# Patient Record
Sex: Male | Born: 1995 | Race: White | Hispanic: No | Marital: Single | Smoking: Never smoker
Health system: Southern US, Community
[De-identification: ages and names within clinical notes are randomized; demographics above are authoritative.]

## PROBLEM LIST (undated history)

## (undated) HISTORY — PX: TUMOR REMOVAL: SHX12

---
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
## 2016-06-25 ENCOUNTER — Emergency Department
Admission: EM | Admit: 2016-06-25 | Discharge: 2016-06-25 | Disposition: A | Discharge status: Home / Self Care | Payer: BC Managed Care – PPO | Attending: Emergency Medicine | Admitting: Emergency Medicine

## 2016-06-25 ENCOUNTER — Emergency Department: Payer: BC Managed Care – PPO

## 2016-06-25 DIAGNOSIS — R569 Unspecified convulsions: Secondary | ICD-10-CM | POA: Diagnosis not present

## 2016-06-25 DIAGNOSIS — R51 Headache: Secondary | ICD-10-CM | POA: Insufficient documentation

## 2016-06-25 DIAGNOSIS — Z5181 Encounter for therapeutic drug level monitoring: Secondary | ICD-10-CM | POA: Insufficient documentation

## 2016-06-25 DIAGNOSIS — M25511 Pain in right shoulder: Secondary | ICD-10-CM | POA: Insufficient documentation

## 2016-06-25 LAB — CBC WITH DIFFERENTIAL/PLATELET
BASOS ABS: 0 10*3/uL (ref 0–0.1)
Basophils Relative: 0 %
EOS ABS: 0.1 10*3/uL (ref 0–0.7)
EOS PCT: 1 %
HCT: 45.9 % (ref 40.0–52.0)
Hemoglobin: 15.9 g/dL (ref 13.0–18.0)
Lymphocytes Relative: 34 %
Lymphs Abs: 2 10*3/uL (ref 1.0–3.6)
MCH: 29.5 pg (ref 26.0–34.0)
MCHC: 34.7 g/dL (ref 32.0–36.0)
MCV: 85.1 fL (ref 80.0–100.0)
Monocytes Absolute: 0.4 10*3/uL (ref 0.2–1.0)
Monocytes Relative: 7 %
Neutro Abs: 3.4 10*3/uL (ref 1.4–6.5)
Neutrophils Relative %: 58 %
PLATELETS: 213 10*3/uL (ref 150–440)
RBC: 5.39 MIL/uL (ref 4.40–5.90)
RDW: 12.8 % (ref 11.5–14.5)
WBC: 5.9 10*3/uL (ref 3.8–10.6)

## 2016-06-25 LAB — COMPREHENSIVE METABOLIC PANEL
ALT: 28 U/L (ref 17–63)
AST: 32 U/L (ref 15–41)
Albumin: 4.5 g/dL (ref 3.5–5.0)
Alkaline Phosphatase: 69 U/L (ref 38–126)
Anion gap: 12 (ref 5–15)
BILIRUBIN TOTAL: 1.6 mg/dL — AB (ref 0.3–1.2)
BUN: 13 mg/dL (ref 6–20)
CALCIUM: 9.2 mg/dL (ref 8.9–10.3)
CO2: 22 mmol/L (ref 22–32)
CREATININE: 0.89 mg/dL (ref 0.61–1.24)
Chloride: 103 mmol/L (ref 101–111)
GFR calc Af Amer: >60 mL/min (ref >60)
Glucose, Bld: 115 mg/dL — ABNORMAL HIGH (ref 65–99)
Potassium: 3.7 mmol/L (ref 3.5–5.1)
Sodium: 137 mmol/L (ref 135–145)
TOTAL PROTEIN: 7.4 g/dL (ref 6.5–8.1)

## 2016-06-25 LAB — URINE DRUG SCREEN, QUALITATIVE (ARMC ONLY)
AMPHETAMINES, UR SCREEN: NOT DETECTED
BARBITURATES, UR SCREEN: NOT DETECTED
Benzodiazepine, Ur Scrn: NOT DETECTED
COCAINE METABOLITE, UR ~~LOC~~: NOT DETECTED
Cannabinoid 50 Ng, Ur ~~LOC~~: NOT DETECTED
MDMA (Ecstasy)Ur Screen: NOT DETECTED
METHADONE SCREEN, URINE: NOT DETECTED
OPIATE, UR SCREEN: NOT DETECTED
Phencyclidine (PCP) Ur S: NOT DETECTED
Tricyclic, Ur Screen: NOT DETECTED

## 2016-06-25 LAB — URINALYSIS COMPLETE WITH MICROSCOPIC (ARMC ONLY)
BILIRUBIN URINE: NEGATIVE
GLUCOSE, UA: NEGATIVE mg/dL
HGB URINE DIPSTICK: NEGATIVE
Ketones, ur: NEGATIVE mg/dL
LEUKOCYTES UA: NEGATIVE
Nitrite: NEGATIVE
Protein, ur: NEGATIVE mg/dL
Specific Gravity, Urine: 1.018 (ref 1.005–1.030)
pH: 6 (ref 5.0–8.0)

## 2016-06-25 NOTE — ED Provider Notes (Signed)
[facility] Emergency Department Provider Note ____________________________________________   I have reviewed the triage vital signs and the triage nursing note.  HISTORY  Chief Complaint Seizures   Historian Patient, mom and dad and other family members at the bedside   HPI Steven Keller is a 20 y.o. male with no significant PMH, presents after witnessed seizure, first time.  Mom heard thud and went into the room and found the patient lying on the ground with arms tense and shaking, and foaming at the mouth. She thinks this probably lasted less than 5 minutes and then the patient was limp and took maybe about 20 minutes or so to come back around to normal mental status. Patient reports no symptoms prior to the episode. He states he woke up around 8 AM and still felt tired and somewhat back to sleep. The next thing he remembers is waking up in the EMS truck.  Family history seizures. No personal history of seizures. He did bite his tongue. He did strike his head and has a bit of headache. Denies vision changes. Denies weakness or numbness. Denies chest pain. Denies recent coughing or recent illnesses.  Mental status is resolved now.    History reviewed. No pertinent past medical history.  There are no active problems to display for this patient.   Past Surgical History:  Procedure Laterality Date  . TUMOR REMOVAL      Prior to Admission medications   Not on File    No Known Allergies  No family history on file.  Social History Social History  Substance Use Topics  . Smoking status: Never Smoker  . Smokeless tobacco: Never Used  . Alcohol use No    Review of Systems  Constitutional: Negative for fever. Eyes: Negative for visual changes. ENT: Negative for Congestion. Cardiovascular: Negative for chest pain. Respiratory: Negative for shortness of breath. Gastrointestinal: Negative for abdominal pain, vomiting and diarrhea. Genitourinary:  Negative for dysuria. Musculoskeletal: Negative for back pain. Skin: Negative for rash. Neurological: Mild headache at the crown of the head where he struck his head. 10 point Review of Systems otherwise negative ____________________________________________   PHYSICAL EXAM:  VITAL SIGNS: ED Triage Vitals  Enc Vitals Group     BP 06/25/16 0939 140/82     Pulse Rate 06/25/16 0939 (!) 114     Resp 06/25/16 0939 20     Temp 06/25/16 0939 98.5 F (36.9 C)     Temp Source 06/25/16 0939 Oral     SpO2 06/25/16 0939 100 %     Weight 06/25/16 0937 180 lb (81.6 kg)     Height 06/25/16 0937 6' (1.829 m)     Head Circumference --      Peak Flow --      Pain Score --      Pain Loc --      Pain Edu? --      Excl. in GC? --      Constitutional: Alert and oriented. Well appearing and in no distress. HEENT   Head: Normocephalic.  Hematoma and abrasion and a semicircular all on the top of his head/crown. No bogginess or laceration.      Eyes: Conjunctivae are normal. PERRL. Normal extraocular movements.      Ears:         Nose: No congestion/rhinnorhea.   Mouth/Throat: Mucous membranes are moist.   Neck: No stridor.  No midline C-spine tenderness to palpation or range of motion. Cardiovascular/Chest: Normal rate, regular  rhythm.  No murmurs, rubs, or gallops. Respiratory: Normal respiratory effort without tachypnea nor retractions. Breath sounds are clear and equal bilaterally. No wheezes/rales/rhonchi. Gastrointestinal: Soft. No distention, no guarding, no rebound. Nontender.    Genitourinary/rectal:Deferred Musculoskeletal: Mild tenderness at the right posterior shoulder, but full range of motion without significant pain. Normal range of motion in all extremities. No joint effusions.  No lower extremity tenderness.  No edema. Neurologic:  Normal speech and language. No gross or focal neurologic deficits are appreciated. Skin:  Skin is warm, dry and intact. No rash  noted. Psychiatric: Mood and affect are normal. Speech and behavior are normal. Patient exhibits appropriate insight and judgment.   ____________________________________________  LABS (pertinent positives/negatives)  Labs Reviewed  URINALYSIS COMPLETEWITH MICROSCOPIC ([facility] ONLY) - Abnormal; Notable for the following:       Result Value   Color, Urine YELLOW (*)    APPearance CLEAR (*)    Bacteria, UA RARE (*)    Squamous Epithelial / LPF 0-5 (*)    All other components within normal limits  COMPREHENSIVE METABOLIC PANEL - Abnormal; Notable for the following:    Glucose, Bld 115 (*)    Total Bilirubin 1.6 (*)    All other components within normal limits  URINE DRUG SCREEN, QUALITATIVE ([facility] ONLY)  CBC WITH DIFFERENTIAL/PLATELET    ____________________________________________    EKG I, Governor Rooksebecca Oak Dorey, MD, the attending physician have personally viewed and interpreted all ECGs.  None ____________________________________________  RADIOLOGY All Xrays were viewed by me. Imaging interpreted by Radiologist.  CT head without contrast:  IMPRESSION: No acute intracranial abnormality. __________________________________________  PROCEDURES  Procedure(s) performed: None  Critical Care performed: None  ____________________________________________   ED COURSE / ASSESSMENT AND PLAN  Pertinent labs & imaging results that were available during my care of the patient were reviewed by me and considered in my medical decision making (see chart for details).   Mr. Steven Keller came in for a first-time seizure, witnessed by mom. Now he is back to normal mental status baseline. I discussed with them blood work and urine evaluation as well as CT of the head with a first-time seizure as well as trauma to the head. Nontender C-spine cleared clinically by me.  Clinically no evidence for meningitis. Neurologic exam intact. Denies illicit drug use or herbs last supplements that may lower the seizure  threshold. No reported exhaustion or sleep deprivation.  CT head normal in appearance. Laboratory studies including electrolytes and white blood cell count and hemoglobin within normal limits.  Maintaining his normal mental status and I have discussed with the neurologist on call Dr. Madalyn RobZeylickman who recommends outpatient work up including likely MRI brain and EEG and follow up with neurologist.  I recommended follow with primary care doctor, either his pediatrician, or as referred to the [facility].   CONSULTATIONS:   Dr. Madalyn RobZeylickman, neurologist by phone.   Patient / Family / Caregiver informed of clinical course, medical decision-making process, and agree with plan.   I discussed return precautions, follow-up instructions, and discharge instructions with patient and/or family.   ___________________________________________   FINAL CLINICAL IMPRESSION(S) / ED DIAGNOSES   Final diagnoses:  First time seizure [facility](HCC)              Note: This dictation was prepared with Dragon dictation. Any transcriptional errors that result from this process are unintentional    Governor Rooksebecca Nyzier Boivin, MD 06/25/16 1353

## 2016-06-25 NOTE — Discharge Instructions (Signed)
You were evaluated after a seizure, and your exam and evaluation are reassuring in the emergency department today.  Return to the emergency department for any additional seizure, or certainly for any weakness or numbness or confusion or altered mental status, or dizziness or passing out, or fever.  You will likely need some additional testing including an outpatient brain MRI, and an outpatient EEG. These may be ordered by your primary doctor (Dr. Cherie OuchNogo) or an internal medicine doctor at [facility], or a neurologist.  Please make an appointment with a primary care doctor and a neurologist.

## 2016-06-25 NOTE — ED Triage Notes (Signed)
Pt came to ED via EMS from home. Per EMS, family heard a thump this morning and found pt on ground, appeared to have seizure activity. Per EMS, pt was postictal. Pt currently alert and oriented x4. Reports does not remember anything until ambulance ride. No history of seizures.

## 2016-06-27 ENCOUNTER — Other Ambulatory Visit: Payer: Self-pay | Admitting: Internal Medicine

## 2016-06-27 DIAGNOSIS — R569 Unspecified convulsions: Secondary | ICD-10-CM

## 2016-06-28 ENCOUNTER — Ambulatory Visit
Admission: RE | Admit: 2016-06-28 | Discharge: 2016-06-28 | Disposition: A | Discharge status: Home / Self Care | Payer: BC Managed Care – PPO | Source: Ambulatory Visit | Attending: Internal Medicine | Admitting: Internal Medicine

## 2016-06-28 ENCOUNTER — Ambulatory Visit: Payer: BC Managed Care – PPO

## 2016-06-28 DIAGNOSIS — R569 Unspecified convulsions: Secondary | ICD-10-CM

## 2016-06-28 MED ORDER — GADOBENATE DIMEGLUMINE 529 MG/ML IV SOLN
15.0000 mL | Freq: Once | INTRAVENOUS | Status: AC | PRN
  Administered 2016-06-28: 15 mL via INTRAVENOUS

## 2017-07-06 IMAGING — MR MR HEAD WO/W CM
10 of 14 series · 33 of 48 positions shown · IV contrast (15mL MULTIHANCE)
Comparison: CT HEAD June 25, 2016.

CLINICAL DATA: Seizure 3 days ago resulting in head injury.

EXAM:
MRI HEAD WITHOUT AND WITH CONTRAST
TECHNIQUE: Multiplanar, multiecho pulse sequences of the brain and surrounding
structures were obtained without and with intravenous contrast.
CONTRAST:  15mL MULTIHANCE GADOBENATE DIMEGLUMINE 529 MG/ML IV SOLN

[Series 5: DWI · axial · 5.0mm · 1.80mm/px · z∈[-51,+105]mm · 2 of 25 slices shown (1 of 2)]
[im 1/25]
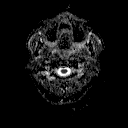
[im 25/25]
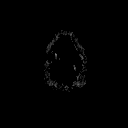

[Series 8: DWI · coronal · 5.0mm · 1.80mm/px · 3 of 35 slices shown (2 of 2)]
[im 1/35]
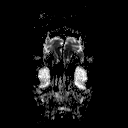
[im 18/35]
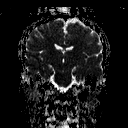
[im 35/35]
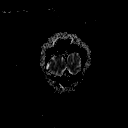

[Series 9: T2 · axial · 5.0mm · 0.45mm/px · z∈[-51,+105]mm · 3 of 25 slices shown (1 of 4)]
[im 1/25]
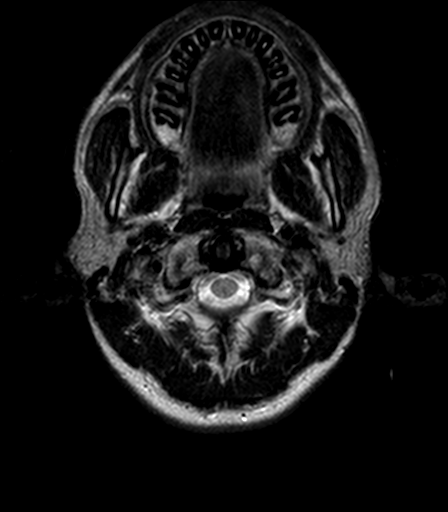
[im 13/25]
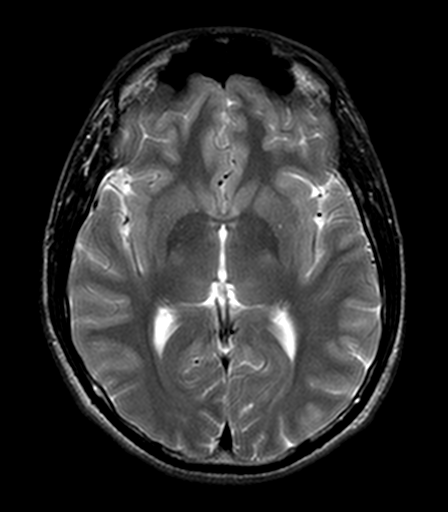
[im 25/25]
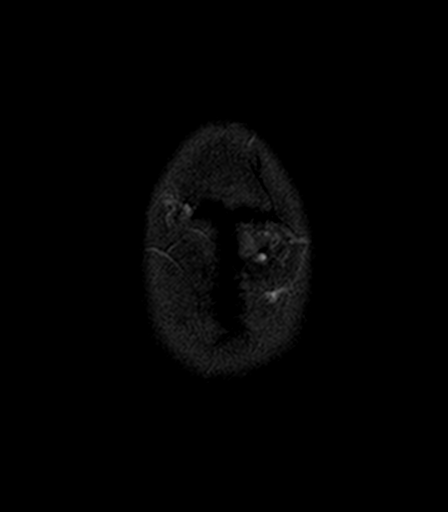

[Series 10: FLAIR · axial · 5.0mm · 0.90mm/px · z∈[-51,+104]mm · 3 of 25 slices shown]
[im 1/25]
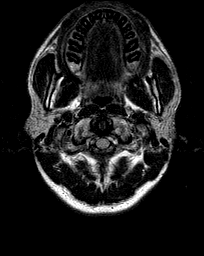
[im 13/25]
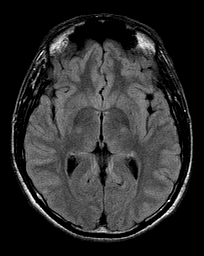
[im 25/25]
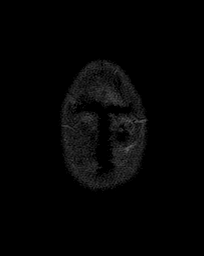

[Series 11: T2 · axial · 5.0mm · 0.72mm/px · z∈[-51,+105]mm · 3 of 25 slices shown (2 of 4)]
[im 1/25]
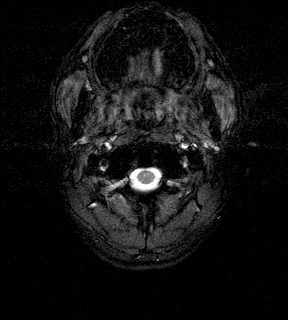
[im 13/25]
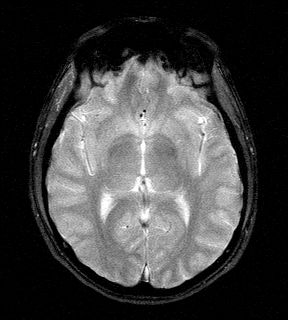
[im 25/25]
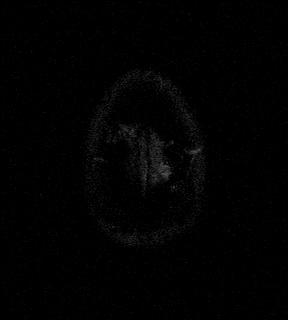

[Series 12: T1 · axial · 3.0mm · 0.45mm/px · z∈[-49,-20]mm · 2 of 52 slices shown]
[im 1/52]
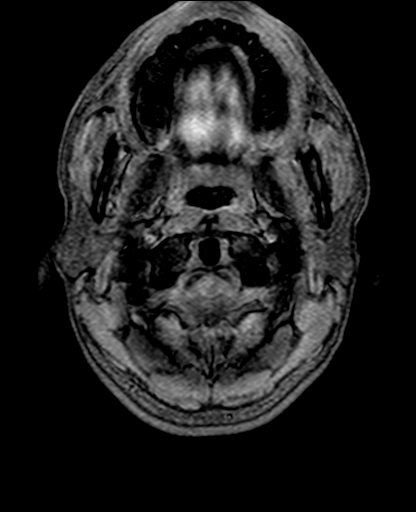
[im 11/52]
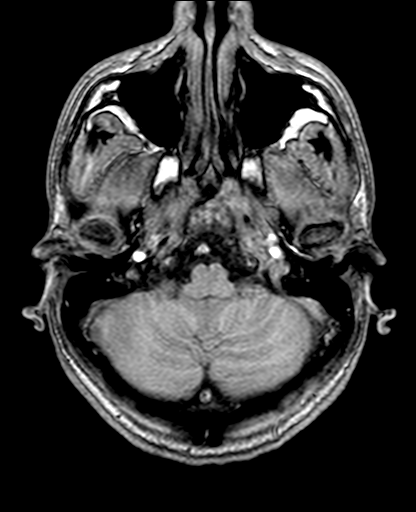

[Series 13: T2 · coronal · 3.0mm · 0.70mm/px · 3 of 24 slices shown (3 of 4)]
[im 1/24]
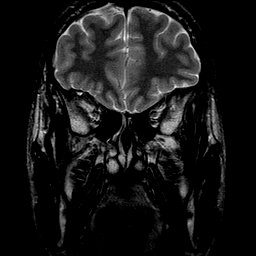
[im 12/24]
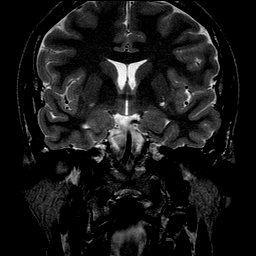
[im 24/24]
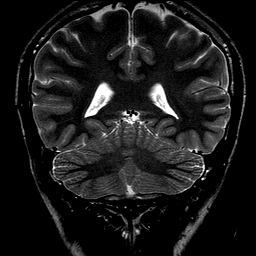

[Series 14: T2 · coronal · 5.0mm · 0.45mm/px · 4 of 35 slices shown (4 of 4)]
[im 1/35]
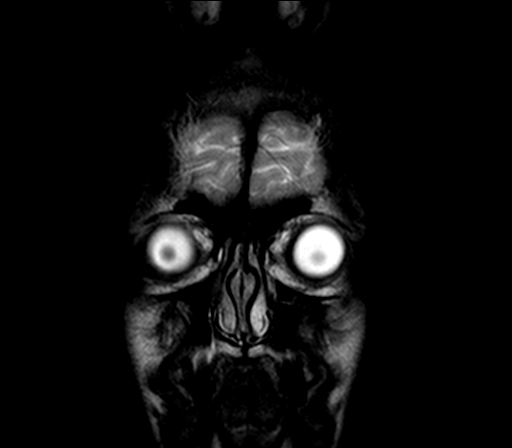
[im 12/35]
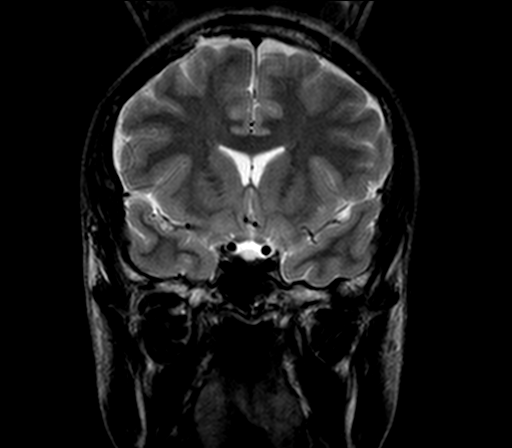
[im 23/35]
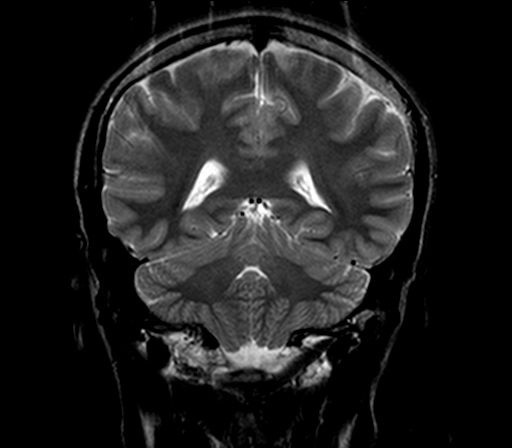
[im 35/35]
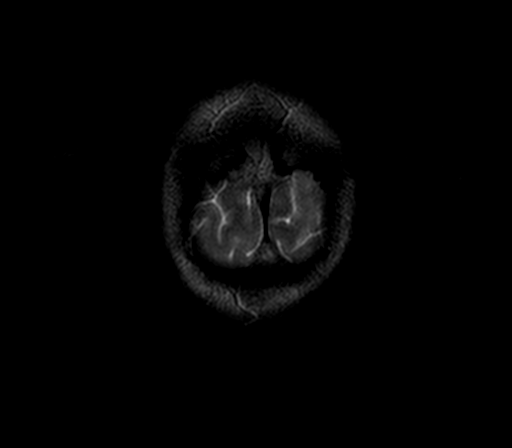

[Series 15: T1 post-contrast · axial · 3.0mm · 0.45mm/px · z∈[-49,+103]mm · 6 of 52 slices shown (1 of 2)]
[im 1/52]
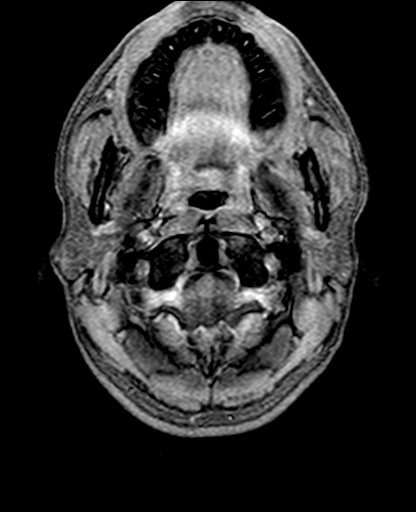
[im 11/52]
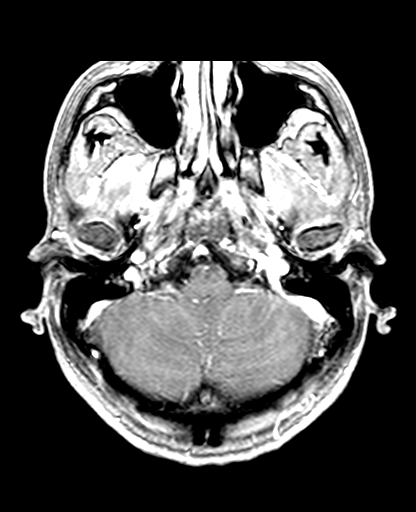
[im 21/52]
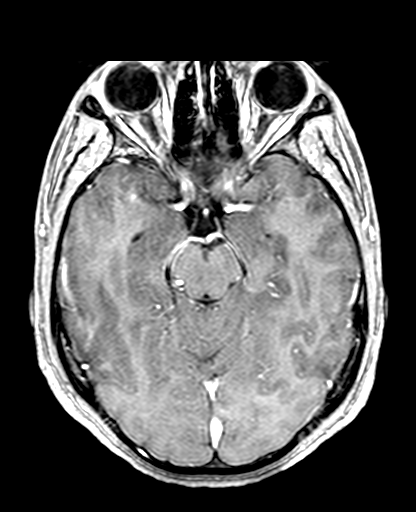
[im 31/52]
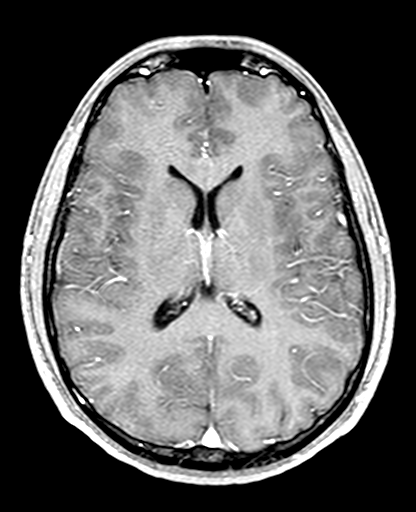
[im 41/52]
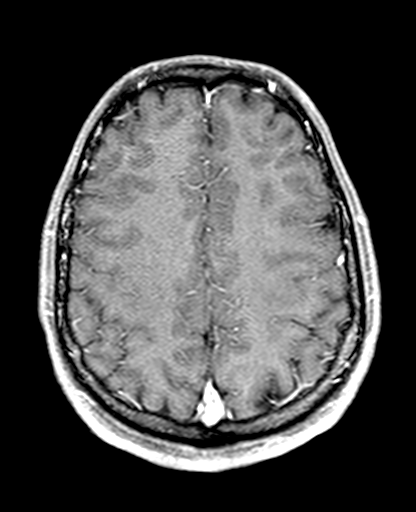
[im 52/52]
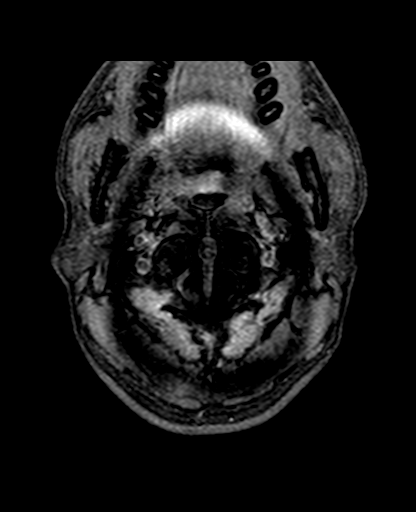

[Series 17: T1 post-contrast · coronal · 5.0mm · 0.45mm/px · 4 of 35 slices shown (2 of 2)]
[im 1/35]
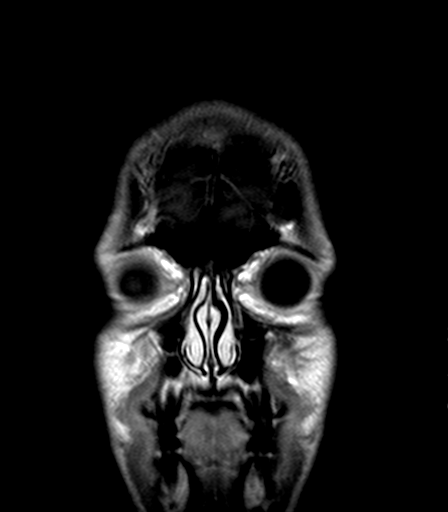
[im 12/35]
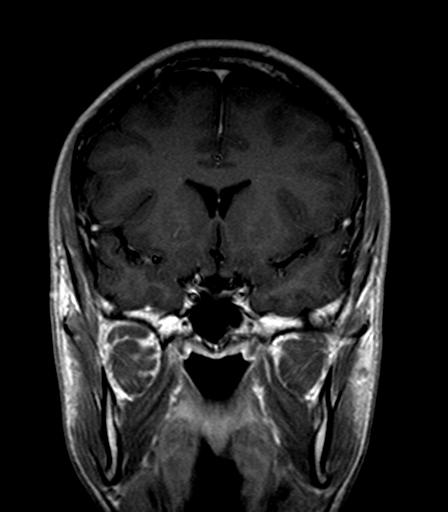
[im 23/35]
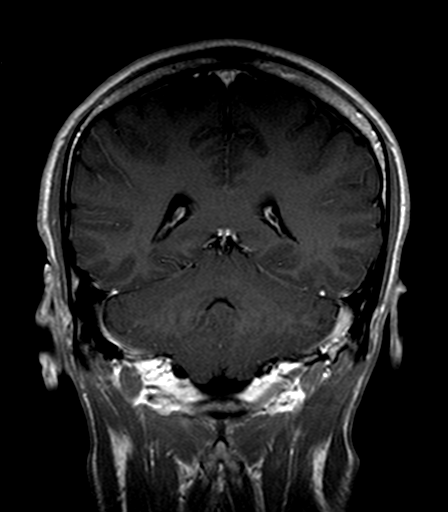
[im 35/35]
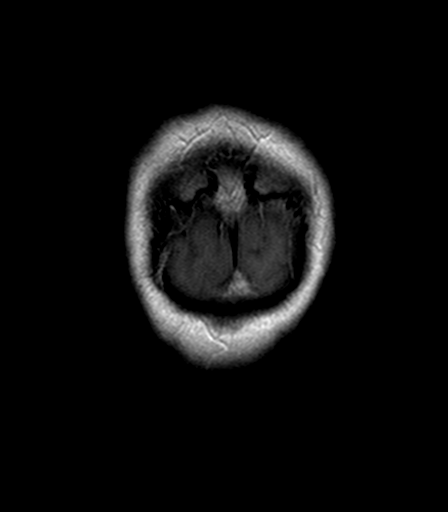

[33 of 48 positions shown; findings below may reference images not displayed]

FINDINGS: INTRACRANIAL CONTENTS: No reduced diffusion to suggest acute
ischemia or hypercellular tumor. No susceptibility artifact to
suggest hemorrhage. The ventricles and sulci are normal for
patient's age. No suspicious parenchymal signal, masses, mass
effect. No abnormal intraparenchymal or extra-axial enhancement. No
abnormal extra-axial fluid collections. No extra-axial masses.
Symmetric size, morphology and signal of the hippocampi.

VASCULAR: Normal major intracranial vascular flow voids present at
skull base.

SKULL AND UPPER CERVICAL SPINE: No abnormal sellar expansion.
Cerebellar tonsils descend 2 mm below the foramen magnum though, are
not pointed in appearance and areas ample cerebral spinal fluid
signal. No suspicious calvarial bone marrow signal. Craniocervical
junction maintained.

SINUSES/ORBITS: The mastoid air-cells and included paranasal sinuses
are well-aerated.The included ocular globes and orbital contents are
non-suspicious.

OTHER: None.
IMPRESSION: Negative MRI head with and without contrast.
